# Patient Record
Sex: Male | Born: 1970 | Hispanic: No | Marital: Married | Smoking: Current some day smoker
Health system: Southern US, Community
[De-identification: ages and names within clinical notes are randomized; demographics above are authoritative.]

## PROBLEM LIST (undated history)

## (undated) DIAGNOSIS — E119 Type 2 diabetes mellitus without complications: Secondary | ICD-10-CM

---

## 2008-04-12 ENCOUNTER — Emergency Department: Payer: Self-pay | Admitting: Emergency Medicine

## 2012-07-17 ENCOUNTER — Emergency Department: Payer: Self-pay | Admitting: Emergency Medicine

## 2013-09-08 ENCOUNTER — Emergency Department: Payer: Self-pay | Admitting: Emergency Medicine
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
## 2017-12-06 ENCOUNTER — Emergency Department
Admission: EM | Admit: 2017-12-06 | Discharge: 2017-12-06 | Disposition: A | Discharge status: Home / Self Care | Payer: Self-pay | Attending: Emergency Medicine | Admitting: Emergency Medicine

## 2017-12-06 ENCOUNTER — Encounter: Payer: Self-pay | Admitting: Emergency Medicine

## 2017-12-06 ENCOUNTER — Other Ambulatory Visit: Payer: Self-pay

## 2017-12-06 DIAGNOSIS — H00024 Hordeolum internum left upper eyelid: Secondary | ICD-10-CM | POA: Insufficient documentation

## 2017-12-06 DIAGNOSIS — F17210 Nicotine dependence, cigarettes, uncomplicated: Secondary | ICD-10-CM | POA: Insufficient documentation

## 2017-12-06 DIAGNOSIS — E119 Type 2 diabetes mellitus without complications: Secondary | ICD-10-CM | POA: Insufficient documentation

## 2017-12-06 HISTORY — DX: Type 2 diabetes mellitus without complications: E11.9

## 2017-12-06 MED ORDER — CEPHALEXIN 500 MG PO CAPS: 500.0000 mg | ORAL_CAPSULE | Freq: Three times a day (TID) | ORAL | 0 refills | Status: AC

## 2017-12-06 MED ORDER — GENTAMICIN SULFATE 0.3 % OP SOLN: 2.0000 [drp] | Freq: Three times a day (TID) | OPHTHALMIC | 0 refills | Status: AC

## 2017-12-06 NOTE — ED Triage Notes (Signed)
Left eye swelling x 6 days.  States initially pain was to lateral aspect of eye.  States he thinks he got a piece of concrete in eye. 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  Works for [employer]

## 2017-12-06 NOTE — ED Provider Notes (Signed)
[facility] Emergency Department Provider Note  ___________________________________________   First MD Initiated Contact with Patient 12/06/17 [phone]     (approximate)  I have reviewed the triage vital signs and the nursing notes.   HISTORY  Chief Complaint No chief complaint on file.    HPI Nathaniel Wu is a 47 y.o. male presents to the ED with left eyelid swollen for approximately 6 days.  Patient states that the area is tender and is slightly red.  Patient is unaware of any injury to his eyelid.  He has not been doing any over-the-counter medications or applying any compresses to the eye.  He denies any visual changes.  Past Medical History:  Diagnosis Date  . Diabetes mellitus without complication (HCC)     There are no active problems to display for this patient.   History reviewed. No pertinent surgical history.  Prior to Admission medications   Medication Sig Start Date End Date Taking? Authorizing Provider  cephALEXin (KEFLEX) 500 MG capsule Take 1 capsule (500 mg total) by mouth 3 (three) times daily. 12/06/17   Tommi Rumps, PA-C  gentamicin (GARAMYCIN) 0.3 % ophthalmic solution Place 2 drops into the left eye 3 (three) times daily for 10 days. 12/06/17 12/16/17  Tommi Rumps, PA-C    Allergies Patient has no known allergies.  No family history on file.  Social History Social History   Tobacco Use  . Smoking status: Current Some Day Smoker    Types: Cigarettes  . Smokeless tobacco: Never Used  Substance Use Topics  . Alcohol use: Yes    Comment: occaional  . Drug use: Not on file    Review of Systems Constitutional: No fever/chills Eyes: Left eyelid swelling +. ENT: No sore throat. Cardiovascular: Denies chest pain. Respiratory: Denies shortness of breath. Gastrointestinal:  No nausea, no vomiting.  Skin: Negative for rash. Neurological: Negative for headaches, focal weakness or  numbness. ___________________________________________   PHYSICAL EXAM:  VITAL SIGNS: ED Triage Vitals  Enc Vitals Group     BP 12/06/17 0857 121/77     Pulse Rate 12/06/17 0857 67     Resp 12/06/17 0857 16     Temp 12/06/17 0857 97.7 F (36.5 C)     Temp Source 12/06/17 0857 Oral     SpO2 12/06/17 0857 98 %     Weight 12/06/17 0855 180 lb (81.6 kg)     Height 12/06/17 0855 5\' 5"  (1.651 m)     Head Circumference --      Peak Flow --      Pain Score 12/06/17 0855 4     Pain Loc --      Pain Edu? --      Excl. in GC? --    Constitutional: Alert and oriented. Well appearing and in no acute distress. Eyes: Conjunctivae are normal. PERRL. EOMI. left upper eyelid is edematous and erythematous.  No tenderness is noted to palpation.  Lid was inverted with small pustule noted to the lateral aspect.  No foreign body was noted. Head: Atraumatic. Mouth/Throat: Mucous membranes are moist.  Oropharynx non-erythematous. Neck: No stridor.   Cardiovascular: Normal rate, regular rhythm. Grossly normal heart sounds.  Good peripheral circulation. Respiratory: Normal respiratory effort.  No retractions. Lungs CTAB. Musculoskeletal: Moves upper and lower extremities without any difficulty.  Normal gait was noted. Neurologic:  Normal speech and language. No gross focal neurologic deficits are appreciated.  Skin:  Skin is warm, dry and intact.  Left  upper eyelid as noted above. Psychiatric: Mood and affect are normal. Speech and behavior are normal.  ____________________________________________   LABS (all labs ordered are listed, but only abnormal results are displayed)  Labs Reviewed - No data to display  PROCEDURES  Procedure(s) performed: None  Procedures  Critical Care performed: No  ____________________________________________   INITIAL IMPRESSION / ASSESSMENT AND PLAN / ED COURSE  As part of my medical decision making, I reviewed the following data within the electronic medical  record:  Notes from prior ED visits and  Controlled Substance Database  Patient was made aware that most likely this is a hordeolum internally and there was some purulent material removed with a Q-tip.  No foreign body was noted and patient was reassured.  Because patient keeps rubbing his eye frequently there was some concern that he may get a corneal abrasion.  Patient was started on gentamicin ophthalmic solution and Keflex 500 mg 3 times daily for 7 days.  He is encouraged to use warm moist compresses to the area frequently.  He will follow-up with [facility] if not improving. ____________________________________________   FINAL CLINICAL IMPRESSION(S) / ED DIAGNOSES  Final diagnoses:  Hordeolum internum left upper eyelid     ED Discharge Orders        Ordered    cephALEXin (KEFLEX) 500 MG capsule  3 times daily     12/06/17 1027    gentamicin (GARAMYCIN) 0.3 % ophthalmic solution  3 times daily     12/06/17 1027       Note:  This document was prepared using Dragon voice recognition software and may include unintentional dictation errors.    Tommi RumpsSummers, Gurbani Figge L, PA-C 12/06/17 1143    Jene EveryKinner, Robert, MD 12/06/17 1246

## 2017-12-06 NOTE — ED Notes (Signed)
First Nurse Note: Pt to ED c/o left eye pain and swelling. Pt in NAD at this time.

## 2017-12-06 NOTE — Discharge Instructions (Signed)
Begin applying warm moist compresses to your left eye.  Follow-up with Dr. Druscilla BrowniePorfilio at [facility] if any continued problems or not improving in 3 to 4 days.  Begin taking Keflex 500 mg 3 times daily for 7 days and Garamycin ophthalmic solution 3 times daily to left eye.  Do not rub your eyes as this may cause increased pain and inflammation.  You may take Tylenol or ibuprofen as needed for pain.

## 2017-12-06 NOTE — ED Notes (Signed)
Pt states would like to file as workman's comp. Pt works for [employer]. No worker's comp profile, pt does not have a number to call his supervisor to see if a UDS or breathalyzer is required for worker's comp.

## 2017-12-06 NOTE — ED Notes (Signed)
Attempted to look up patient's employers profile for WC.  Unable to locate any profile.

## 2017-12-06 NOTE — ED Notes (Signed)
NAD noted at time of D/C. Pt denies questions or concerns. Pt ambulatory to the lobby at this time. Marchelle FolksAmanda, Interpreter at bedside to review D/C instructions.

## 2020-08-06 ENCOUNTER — Other Ambulatory Visit: Payer: Self-pay

## 2020-08-06 DIAGNOSIS — Z20822 Contact with and (suspected) exposure to covid-19: Secondary | ICD-10-CM

## 2020-08-07 LAB — NOVEL CORONAVIRUS, NAA: SARS-CoV-2, NAA: NOT DETECTED

## 2020-08-07 LAB — SARS-COV-2, NAA 2 DAY TAT

## 2020-09-20 ENCOUNTER — Emergency Department
Admission: EM | Admit: 2020-09-20 | Discharge: 2020-09-20 | Disposition: A | Discharge status: Home / Self Care | Payer: Self-pay | Attending: Emergency Medicine | Admitting: Emergency Medicine

## 2020-09-20 ENCOUNTER — Other Ambulatory Visit: Payer: Self-pay

## 2020-09-20 DIAGNOSIS — E119 Type 2 diabetes mellitus without complications: Secondary | ICD-10-CM | POA: Insufficient documentation

## 2020-09-20 DIAGNOSIS — F17210 Nicotine dependence, cigarettes, uncomplicated: Secondary | ICD-10-CM | POA: Insufficient documentation

## 2020-09-20 DIAGNOSIS — L03115 Cellulitis of right lower limb: Secondary | ICD-10-CM | POA: Insufficient documentation

## 2020-09-20 MED ORDER — OXYCODONE-ACETAMINOPHEN 5-325 MG PO TABS
1.0000 | ORAL_TABLET | Freq: Once | ORAL | Status: AC
  Administered 2020-09-20: 1 via ORAL
  Filled 2020-09-20: qty 1

## 2020-09-20 MED ORDER — CLINDAMYCIN PHOSPHATE 600 MG/4ML IJ SOLN
600.0000 mg | Freq: Once | INTRAMUSCULAR | Status: AC
Start: 2020-09-20 — End: 2020-09-20
  Administered 2020-09-20: 600 mg via INTRAMUSCULAR
  Filled 2020-09-20: qty 4

## 2020-09-20 MED ORDER — CLINDAMYCIN HCL 300 MG PO CAPS: 300.0000 mg | ORAL_CAPSULE | Freq: Four times a day (QID) | ORAL | 0 refills | Status: AC

## 2020-09-20 NOTE — ED Triage Notes (Addendum)
Pt has quarter size area to right lower leg with a scab and redness around it. Pt states started 3 days ago unsure of insect bite. Went to [facility] this am and started on doxycycline.

## 2020-09-20 NOTE — ED Provider Notes (Signed)
[facility] Emergency Department Provider Note  ____________________________________________  Time seen: Approximately 9:39 PM  I have reviewed the triage vital signs and the nursing notes.   HISTORY  Chief Complaint Wound Check    HPI Nathaniel Wu is a 50 y.o. male who presents the emergency department for evaluation of an infection to the right lower leg.  Patient states he had a "spot", come up on his leg that has become erythematous and painful.   Patient states that he has had no drainage from the site.  He states that he does frequently get these areas, currently has another area to the left leg that appears to be healing.  Patient states that as he is diabetic he has been told that he should have all wounds and infections checked out.  No fevers or chills.  No medications prior to arrival.        Past Medical History:  Diagnosis Date  . Diabetes mellitus without complication (HCC)     There are no problems to display for this patient.   No past surgical history on file.  Prior to Admission medications   Medication Sig Start Date End Date Taking? Authorizing Provider  clindamycin (CLEOCIN) 300 MG capsule Take 1 capsule (300 mg total) by mouth 4 (four) times daily. 09/20/20  Yes Cuthriell, Delorise Royals, PA-C  cephALEXin (KEFLEX) 500 MG capsule Take 1 capsule (500 mg total) by mouth 3 (three) times daily. 12/06/17   Tommi Rumps, PA-C    Allergies Patient has no known allergies.  No family history on file.  Social History Social History   Tobacco Use  . Smoking status: Current Some Day Smoker    Types: Cigarettes  . Smokeless tobacco: Never Used  Substance Use Topics  . Alcohol use: Yes    Comment: occaional     Review of Systems  Constitutional: No fever/chills Eyes: No visual changes. No discharge ENT: No upper respiratory complaints. Cardiovascular: no chest pain. Respiratory: no cough. No SOB. Gastrointestinal: No  abdominal pain.  No nausea, no vomiting.  No diarrhea.  No constipation. Musculoskeletal: Negative for musculoskeletal pain. Skin: Possible infection to the right lower extremity Neurological: Negative for headaches, focal weakness or numbness.  10 System ROS otherwise negative.  ____________________________________________   PHYSICAL EXAM:  VITAL SIGNS: ED Triage Vitals  Enc Vitals Group     BP 09/20/20 2050 140/88     Pulse Rate 09/20/20 2050 69     Resp 09/20/20 2050 20     Temp 09/20/20 2050 98 F (36.7 C)     Temp Source 09/20/20 2050 Oral     SpO2 09/20/20 2050 99 %     Weight 09/20/20 2051 170 lb (77.1 kg)     Height 09/20/20 2051 5\' 5"  (1.651 m)     Head Circumference --      Peak Flow --      Pain Score 09/20/20 2050 8     Pain Loc --      Pain Edu? --      Excl. in GC? --      Constitutional: Alert and oriented. Well appearing and in no acute distress. Eyes: Conjunctivae are normal. PERRL. EOMI. Head: Atraumatic. ENT:      Ears:       Nose: No congestion/rhinnorhea.      Mouth/Throat: Mucous membranes are moist.  Neck: No stridor.    Cardiovascular: Normal rate, regular rhythm. Normal S1 and S2.  Good peripheral circulation. Respiratory:  Normal respiratory effort without tachypnea or retractions. Lungs CTAB. Good air entry to the bases with no decreased or absent breath sounds. Musculoskeletal: Full range of motion to all extremities. No gross deformities appreciated. Neurologic:  Normal speech and language. No gross focal neurologic deficits are appreciated.  Skin:  Skin is warm, dry and intact. No rash noted.  Visualization of the right leg revealed an erythematous lesion with central hematoma.  It appears that patient had a soft tissue injury resulting in a hematoma with surrounding infection.  No evidence of purulence in the hematoma.  Area is marked with indelible marker.  Area is tender to palpation.  Other than hematoma area, there is no fluctuance or  induration.  No streaking. Psychiatric: Mood and affect are normal. Speech and behavior are normal. Patient exhibits appropriate insight and judgement.   ____________________________________________   LABS (all labs ordered are listed, but only abnormal results are displayed)  Labs Reviewed - No data to display ____________________________________________  EKG   ____________________________________________  RADIOLOGY   No results found.  ____________________________________________    PROCEDURES  Procedure(s) performed:    Procedures    Medications  clindamycin (CLEOCIN) injection 600 mg (has no administration in time range)  oxyCODONE-acetaminophen (PERCOCET/ROXICET) 5-325 MG per tablet 1 tablet (has no administration in time range)     ____________________________________________   INITIAL IMPRESSION / ASSESSMENT AND PLAN / ED COURSE  Pertinent labs & imaging results that were available during my care of the patient were reviewed by me and considered in my medical decision making (see chart for details).  Review of the [place] CSRS was performed in accordance of the [facility] prior to dispensing any controlled drugs.           Patient's diagnosis is consistent with cellulitis to the right leg.  Patient appears to have had a soft tissue injury with hematoma with surrounding cellulitis.  There was no drainable abscess visualized on exam.  Patient will be given IM clindamycin here with oral clindamycin at home.  Area was marked with indelible marker as patient is a diabetic and instructed to return if there is any extension past the indelible marker by 2 inches or more.  Patient verbalizes understanding of same..Patient is given ED precautions to return to the ED for any worsening or new symptoms.     ____________________________________________  FINAL CLINICAL IMPRESSION(S) / ED DIAGNOSES  Final diagnoses:  Cellulitis of right lower extremity      NEW  MEDICATIONS STARTED DURING THIS VISIT:  ED Discharge Orders         Ordered    clindamycin (CLEOCIN) 300 MG capsule  4 times daily        09/20/20 2145              This chart was dictated using voice recognition software/Dragon. Despite best efforts to proofread, errors can occur which can change the meaning. Any change was purely unintentional.    Racheal Patches, PA-C 09/20/20 2150    Chesley Noon, MD 09/20/20 2317

## 2023-05-11 ENCOUNTER — Encounter: Payer: Self-pay | Admitting: *Deleted
